# Patient Record
Sex: Female | Born: 1970 | Hispanic: No | Smoking: Never smoker
Health system: Southern US, Community
[De-identification: ages and names within clinical notes are randomized; demographics above are authoritative.]

## PROBLEM LIST (undated history)

## (undated) DIAGNOSIS — Z9889 Other specified postprocedural states: Secondary | ICD-10-CM

## (undated) HISTORY — DX: Other specified postprocedural states: Z98.890

---

## 1995-06-23 HISTORY — PX: AUGMENTATION MAMMAPLASTY: SUR837

## 1999-04-07 ENCOUNTER — Other Ambulatory Visit: Admission: RE | Admit: 1999-04-07 | Discharge: 1999-04-07 | Payer: Self-pay | Admitting: Gynecology

## 2000-05-24 ENCOUNTER — Other Ambulatory Visit: Admission: RE | Admit: 2000-05-24 | Discharge: 2000-05-24 | Payer: Self-pay | Admitting: Gynecology

## 2010-11-11 ENCOUNTER — Other Ambulatory Visit: Payer: Self-pay | Admitting: Internal Medicine

## 2010-11-11 DIAGNOSIS — Z1231 Encounter for screening mammogram for malignant neoplasm of breast: Secondary | ICD-10-CM

## 2010-11-11 DIAGNOSIS — Z0000 Encounter for general adult medical examination without abnormal findings: Secondary | ICD-10-CM

## 2010-11-22 ENCOUNTER — Ambulatory Visit
Admission: RE | Admit: 2010-11-22 | Discharge: 2010-11-22 | Disposition: A | Discharge status: Home / Self Care | Payer: BC Managed Care – PPO | Source: Ambulatory Visit | Attending: Internal Medicine | Admitting: Internal Medicine

## 2010-11-22 DIAGNOSIS — Z0000 Encounter for general adult medical examination without abnormal findings: Secondary | ICD-10-CM

## 2010-11-22 DIAGNOSIS — Z1231 Encounter for screening mammogram for malignant neoplasm of breast: Secondary | ICD-10-CM

## 2012-08-11 ENCOUNTER — Other Ambulatory Visit: Payer: Self-pay | Admitting: Internal Medicine

## 2012-08-11 ENCOUNTER — Other Ambulatory Visit: Payer: Self-pay | Admitting: Family Medicine

## 2012-08-11 DIAGNOSIS — Z1231 Encounter for screening mammogram for malignant neoplasm of breast: Secondary | ICD-10-CM

## 2012-09-15 ENCOUNTER — Ambulatory Visit
Admission: RE | Admit: 2012-09-15 | Discharge: 2012-09-15 | Disposition: A | Discharge status: Home / Self Care | Payer: BC Managed Care – PPO | Source: Ambulatory Visit | Attending: Internal Medicine | Admitting: Internal Medicine

## 2012-09-15 DIAGNOSIS — Z1231 Encounter for screening mammogram for malignant neoplasm of breast: Secondary | ICD-10-CM

## 2013-08-10 ENCOUNTER — Other Ambulatory Visit: Payer: Self-pay

## 2013-08-10 DIAGNOSIS — Z1231 Encounter for screening mammogram for malignant neoplasm of breast: Secondary | ICD-10-CM

## 2013-09-24 ENCOUNTER — Ambulatory Visit
Admission: RE | Admit: 2013-09-24 | Discharge: 2013-09-24 | Disposition: A | Discharge status: Home / Self Care | Payer: BC Managed Care – PPO | Source: Ambulatory Visit

## 2013-09-24 DIAGNOSIS — Z1231 Encounter for screening mammogram for malignant neoplasm of breast: Secondary | ICD-10-CM

## 2014-08-24 ENCOUNTER — Other Ambulatory Visit: Payer: Self-pay

## 2014-08-24 DIAGNOSIS — Z1231 Encounter for screening mammogram for malignant neoplasm of breast: Secondary | ICD-10-CM

## 2014-09-29 ENCOUNTER — Ambulatory Visit
Admission: RE | Admit: 2014-09-29 | Discharge: 2014-09-29 | Disposition: A | Discharge status: Home / Self Care | Payer: BC Managed Care – PPO | Source: Ambulatory Visit

## 2014-09-29 DIAGNOSIS — Z1231 Encounter for screening mammogram for malignant neoplasm of breast: Secondary | ICD-10-CM

## 2015-08-22 ENCOUNTER — Other Ambulatory Visit: Payer: Self-pay

## 2015-08-22 DIAGNOSIS — Z1231 Encounter for screening mammogram for malignant neoplasm of breast: Secondary | ICD-10-CM

## 2015-10-03 ENCOUNTER — Ambulatory Visit
Admission: RE | Admit: 2015-10-03 | Discharge: 2015-10-03 | Disposition: A | Discharge status: Home / Self Care | Payer: BLUE CROSS/BLUE SHIELD | Source: Ambulatory Visit

## 2015-10-03 DIAGNOSIS — Z1231 Encounter for screening mammogram for malignant neoplasm of breast: Secondary | ICD-10-CM

## 2016-08-28 ENCOUNTER — Other Ambulatory Visit: Payer: Self-pay | Admitting: Obstetrics and Gynecology

## 2016-08-28 ENCOUNTER — Other Ambulatory Visit: Payer: Self-pay | Admitting: Family Medicine

## 2016-08-28 DIAGNOSIS — Z1231 Encounter for screening mammogram for malignant neoplasm of breast: Secondary | ICD-10-CM

## 2016-10-03 ENCOUNTER — Ambulatory Visit
Admission: RE | Admit: 2016-10-03 | Discharge: 2016-10-03 | Disposition: A | Discharge status: Home / Self Care | Payer: BLUE CROSS/BLUE SHIELD | Source: Ambulatory Visit | Attending: Obstetrics and Gynecology | Admitting: Obstetrics and Gynecology

## 2016-10-03 DIAGNOSIS — Z1231 Encounter for screening mammogram for malignant neoplasm of breast: Secondary | ICD-10-CM

## 2017-01-29 DIAGNOSIS — N393 Stress incontinence (female) (male): Secondary | ICD-10-CM | POA: Insufficient documentation

## 2017-08-26 ENCOUNTER — Other Ambulatory Visit: Payer: Self-pay | Admitting: Obstetrics and Gynecology

## 2017-08-26 DIAGNOSIS — Z1231 Encounter for screening mammogram for malignant neoplasm of breast: Secondary | ICD-10-CM

## 2017-10-04 ENCOUNTER — Ambulatory Visit
Admission: RE | Admit: 2017-10-04 | Discharge: 2017-10-04 | Disposition: A | Discharge status: Home / Self Care | Payer: BLUE CROSS/BLUE SHIELD | Source: Ambulatory Visit | Attending: Obstetrics and Gynecology | Admitting: Obstetrics and Gynecology

## 2017-10-04 DIAGNOSIS — Z1231 Encounter for screening mammogram for malignant neoplasm of breast: Secondary | ICD-10-CM

## 2019-01-06 ENCOUNTER — Other Ambulatory Visit: Payer: Self-pay | Admitting: Obstetrics and Gynecology

## 2019-01-06 DIAGNOSIS — Z1231 Encounter for screening mammogram for malignant neoplasm of breast: Secondary | ICD-10-CM

## 2019-01-28 ENCOUNTER — Ambulatory Visit: Payer: PRIVATE HEALTH INSURANCE

## 2019-03-19 ENCOUNTER — Ambulatory Visit: Payer: PRIVATE HEALTH INSURANCE

## 2019-05-06 ENCOUNTER — Other Ambulatory Visit: Payer: Self-pay

## 2019-05-06 ENCOUNTER — Ambulatory Visit
Admission: RE | Admit: 2019-05-06 | Discharge: 2019-05-06 | Disposition: A | Discharge status: Home / Self Care | Payer: BC Managed Care – PPO | Source: Ambulatory Visit | Attending: Obstetrics and Gynecology | Admitting: Obstetrics and Gynecology

## 2019-05-06 DIAGNOSIS — Z1231 Encounter for screening mammogram for malignant neoplasm of breast: Secondary | ICD-10-CM

## 2020-04-26 ENCOUNTER — Other Ambulatory Visit: Payer: Self-pay | Admitting: Obstetrics and Gynecology

## 2020-04-26 DIAGNOSIS — Z1231 Encounter for screening mammogram for malignant neoplasm of breast: Secondary | ICD-10-CM

## 2020-05-27 ENCOUNTER — Other Ambulatory Visit: Payer: Self-pay

## 2020-05-27 ENCOUNTER — Encounter: Payer: Self-pay | Admitting: Plastic Surgery

## 2020-05-27 ENCOUNTER — Ambulatory Visit (INDEPENDENT_AMBULATORY_CARE_PROVIDER_SITE_OTHER): Payer: No Typology Code available for payment source | Admitting: Plastic Surgery

## 2020-05-27 DIAGNOSIS — D229 Melanocytic nevi, unspecified: Secondary | ICD-10-CM | POA: Insufficient documentation

## 2020-05-27 NOTE — Progress Notes (Signed)
     Patient ID: Jodi Kennedy, female    DOB: [birth year], 49 y.o.   MRN: [id number]   Chief Complaint  Patient presents with  . Consult  . Skin Problem    The patient is a 49 year old female here for evaluation of several skin lesions.  She has seen the dermatologist.  She is interested in having them removed.  The dermatologist is not concerned with skin cancer at this time.  The main concern is that they are getting larger.  They are flesh-colored.  There is one on the right lower cheek, right nasal ala and left lobule.  They are 3 to 4 mm in size.  There are no other concerning lesions.  She does not have a family history of skin cancer she is otherwise in good health.  Nothing seems to make these areas better.  They do seem to be getting larger with time.  Her main concern is what the scar will look like.  Several years ago we excised a nevus on her daughter's face.  She says that you cannot even see where it was.  She is very pleased with the results.   Review of Systems  Constitutional: Negative.   HENT: Negative.   Eyes: Negative.   Respiratory: Negative.   Cardiovascular: Negative.   Endocrine: Negative.   Genitourinary: Negative.   Musculoskeletal: Negative.   Neurological: Negative.   Hematological: Negative.   Psychiatric/Behavioral: Negative.     History reviewed. No pertinent past medical history.  Past Surgical History:  Procedure Laterality Date  . AUGMENTATION MAMMAPLASTY Bilateral 06/1995      Current Outpatient Medications:  .  ibuprofen (ADVIL) 800 MG tablet, 1 tab p.o. every 6 hours as needed pain, Disp: , Rfl:  .  Multiple Vitamin (MULTIVITAMIN) capsule, Take 1 capsule by mouth daily., Disp: , Rfl:    Objective:   Vitals:   05/27/20 0840  BP: 138/79  Pulse: 71  Temp: 98.3 F (36.8 C)  SpO2: 97%    Physical Exam Vitals and nursing note reviewed.  Constitutional:      Appearance: Normal appearance.  HENT:     Head: Normocephalic and atraumatic.    Cardiovascular:     Rate and Rhythm: Normal rate.     Pulses: Normal pulses.  Pulmonary:     Effort: Pulmonary effort is normal.  Skin:    General: Skin is warm.  Neurological:     General: No focal deficit present.     Mental Status: She is alert and oriented to person, place, and time.  Psychiatric:        Mood and Affect: Mood normal.        Behavior: Behavior normal.     Assessment & Plan:  Nevus  We discussed options for treatment.  She can have it shaved off but there is a likelihood it would return.  She can also have each of them excised.  She would have a small scar.  It would probably fade in time.  Should be a good candidate for using the skin after the excision. She would like to move ahead with a quote for excision of the 3 areas.  This includes the right lower face, right ala and left lobule.  Pictures were obtained of the patient and placed in the chart with the patient's or guardian's permission.   [place], DO

## 2020-06-15 ENCOUNTER — Ambulatory Visit
Admission: RE | Admit: 2020-06-15 | Discharge: 2020-06-15 | Disposition: A | Discharge status: Home / Self Care | Payer: No Typology Code available for payment source | Source: Ambulatory Visit | Attending: Obstetrics and Gynecology | Admitting: Obstetrics and Gynecology

## 2020-06-15 ENCOUNTER — Other Ambulatory Visit: Payer: Self-pay

## 2020-06-15 DIAGNOSIS — Z1231 Encounter for screening mammogram for malignant neoplasm of breast: Secondary | ICD-10-CM

## 2020-07-26 ENCOUNTER — Other Ambulatory Visit: Payer: Self-pay

## 2020-07-26 ENCOUNTER — Encounter: Payer: Self-pay | Admitting: Plastic Surgery

## 2020-07-26 ENCOUNTER — Ambulatory Visit (INDEPENDENT_AMBULATORY_CARE_PROVIDER_SITE_OTHER): Payer: No Typology Code available for payment source | Admitting: Plastic Surgery

## 2020-07-26 VITALS — BP 126/84 | HR 86 | Temp 98.2°F

## 2020-07-26 DIAGNOSIS — D229 Melanocytic nevi, unspecified: Secondary | ICD-10-CM | POA: Diagnosis not present

## 2020-07-26 NOTE — Progress Notes (Addendum)
Procedure Note  Preoperative Dx: Mole on nose and chin  Postoperative Dx: Same  Procedure: Excision of mole of nose 4 mm and chin 5 mm  Anesthesia: Lidocaine 1% with 1:100,000 epinepherine   Description of Procedure: Risks and complications were explained to the patient.  Consent was confirmed and the patient understands the risks and benefits.  The potential complications and alternatives were explained and the patient consents.  The patient expressed understanding the option of not having the procedure and the risks of a scar.  Time out was called and all information was confirmed to be correct.  The area was prepped and drapped.  Lidocaine 1% with epinepherine was injected in the subcutaneous area.   Chin: After waiting several minutes for the local to take affect a #15 blade was used to excise the area in an eliptical pattern.  A 5-0 Monocryl was used to close the skin edges.    Nose:  After waiting several minutes for the local to take affect a #15 blade was used to excise the area in an eliptical pattern.  A 5-0 Monocryl was used to close the skin edges.    A dressing was applied.  The patient was given instructions on how to care for the area and a follow up appointment.  Jeneva tolerated the procedure well and there were no complications. The patient decided to forego pathology and I think this is reasonable.

## 2020-08-05 ENCOUNTER — Other Ambulatory Visit: Payer: Self-pay

## 2020-08-05 ENCOUNTER — Encounter: Payer: Self-pay | Admitting: Surgical

## 2020-08-05 ENCOUNTER — Ambulatory Visit (INDEPENDENT_AMBULATORY_CARE_PROVIDER_SITE_OTHER): Payer: No Typology Code available for payment source | Admitting: Surgical

## 2020-08-05 VITALS — BP 125/81 | HR 77 | Temp 97.8°F

## 2020-08-05 DIAGNOSIS — Z719 Counseling, unspecified: Secondary | ICD-10-CM

## 2020-08-05 DIAGNOSIS — D229 Melanocytic nevi, unspecified: Secondary | ICD-10-CM

## 2020-08-05 NOTE — Progress Notes (Signed)
Patient is a 49 year old female here for follow-up after excision of a chin nevus and nose nevus on 07/26/2020 with Dr. Marla Roe.  Patient is doing well, no complaints.  No infectious symptoms.  On exam incisions are healed, some crusting noted.  Monocryl sutures in place.  No erythema.  No cellulitic changes.  No drainage noted.  Recommend sunscreen when exposed to the sun. Can begin using scar cream in the next 3 days or so. Follow-up as needed.  Call with any questions or concerns.

## 2020-10-01 DIAGNOSIS — E559 Vitamin D deficiency, unspecified: Secondary | ICD-10-CM | POA: Insufficient documentation

## 2020-10-01 DIAGNOSIS — E7800 Pure hypercholesterolemia, unspecified: Secondary | ICD-10-CM | POA: Insufficient documentation

## 2021-05-30 ENCOUNTER — Other Ambulatory Visit: Payer: Self-pay | Admitting: Obstetrics and Gynecology

## 2021-05-30 DIAGNOSIS — Z1231 Encounter for screening mammogram for malignant neoplasm of breast: Secondary | ICD-10-CM

## 2021-07-19 ENCOUNTER — Ambulatory Visit
Admission: RE | Admit: 2021-07-19 | Discharge: 2021-07-19 | Disposition: A | Discharge status: Home / Self Care | Payer: No Typology Code available for payment source | Source: Ambulatory Visit | Attending: Obstetrics and Gynecology | Admitting: Obstetrics and Gynecology

## 2021-07-19 ENCOUNTER — Other Ambulatory Visit: Payer: Self-pay

## 2021-07-19 DIAGNOSIS — Z1231 Encounter for screening mammogram for malignant neoplasm of breast: Secondary | ICD-10-CM

## 2022-02-15 DIAGNOSIS — R030 Elevated blood-pressure reading, without diagnosis of hypertension: Secondary | ICD-10-CM | POA: Insufficient documentation

## 2022-03-03 IMAGING — MG DIGITAL SCREENING BREAST BILAT IMPLANT W/ TOMO W/ CAD
9 of 12 series · 9 of 28 positions shown · non-contrast
Comparison: Previous exam(s).

CLINICAL DATA: Screening.

EXAM:
DIGITAL SCREENING BILATERAL MAMMOGRAM WITH IMPLANTS, CAD AND
TOMOSYNTHESIS
TECHNIQUE: Bilateral screening digital craniocaudal and mediolateral oblique
mammograms were obtained. Bilateral screening digital breast
tomosynthesis was performed. The images were evaluated with
computer-aided detection. Standard and/or implant displaced views
were performed.

[R CC]
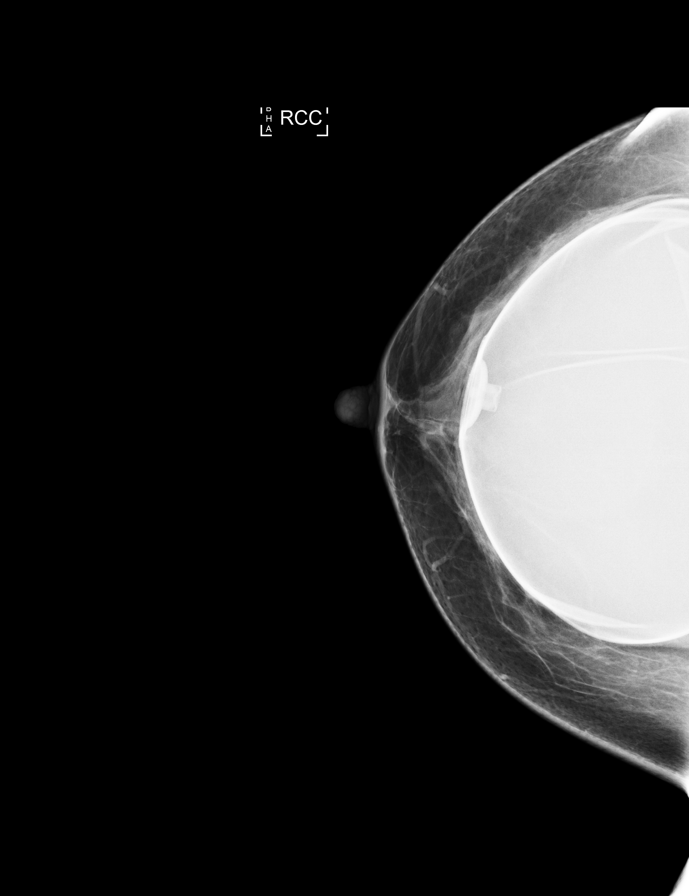

[R MLO]
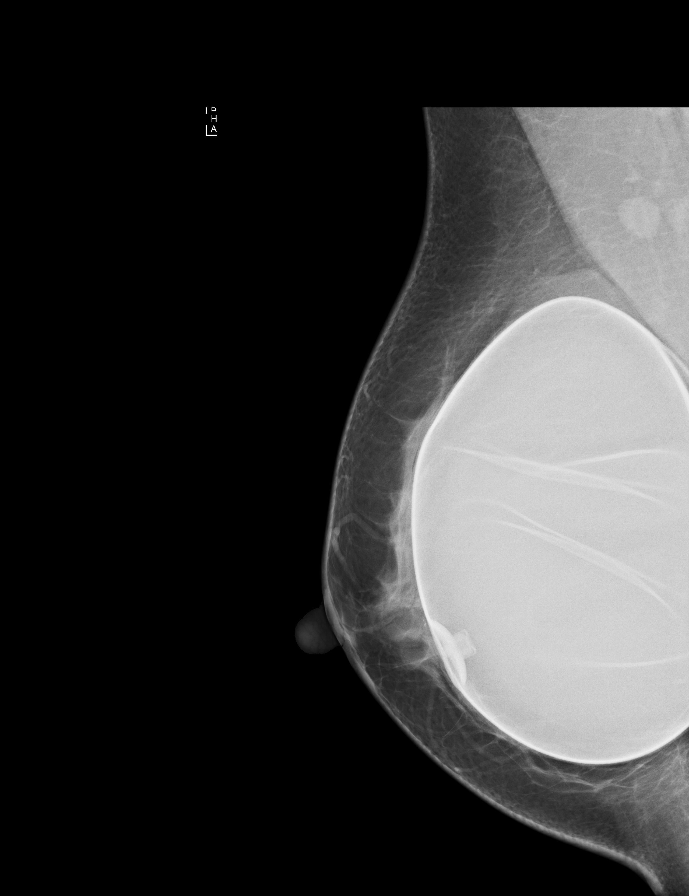

[L MLO]
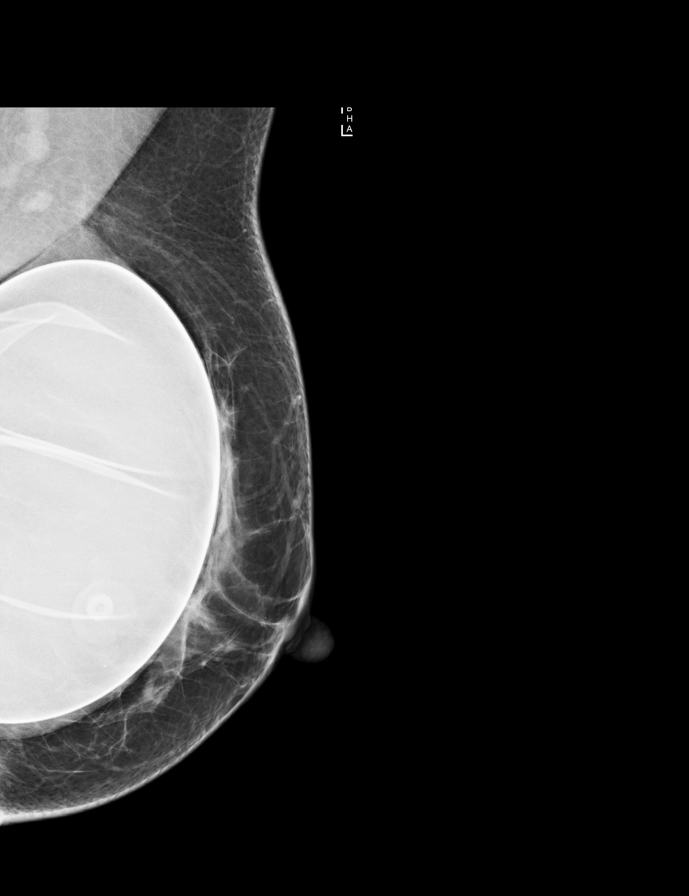

[L CC]
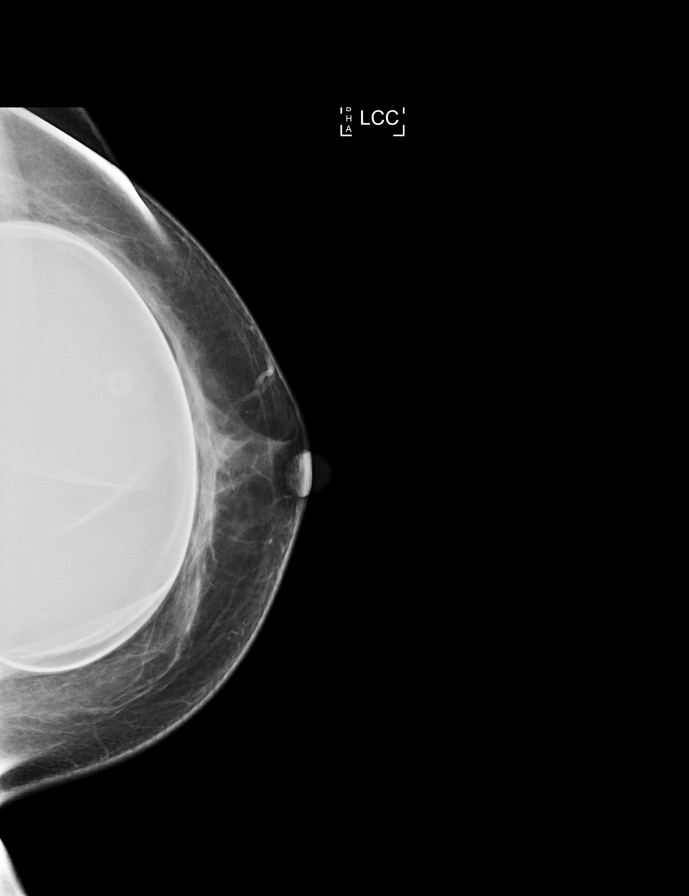

[R MLO synth-2D]
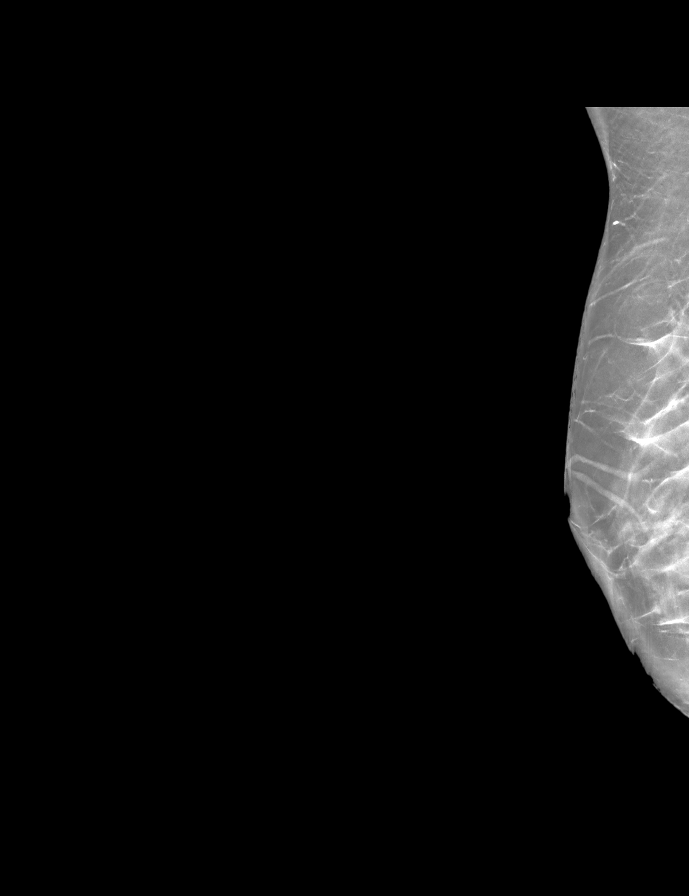

[R CC synth-2D]
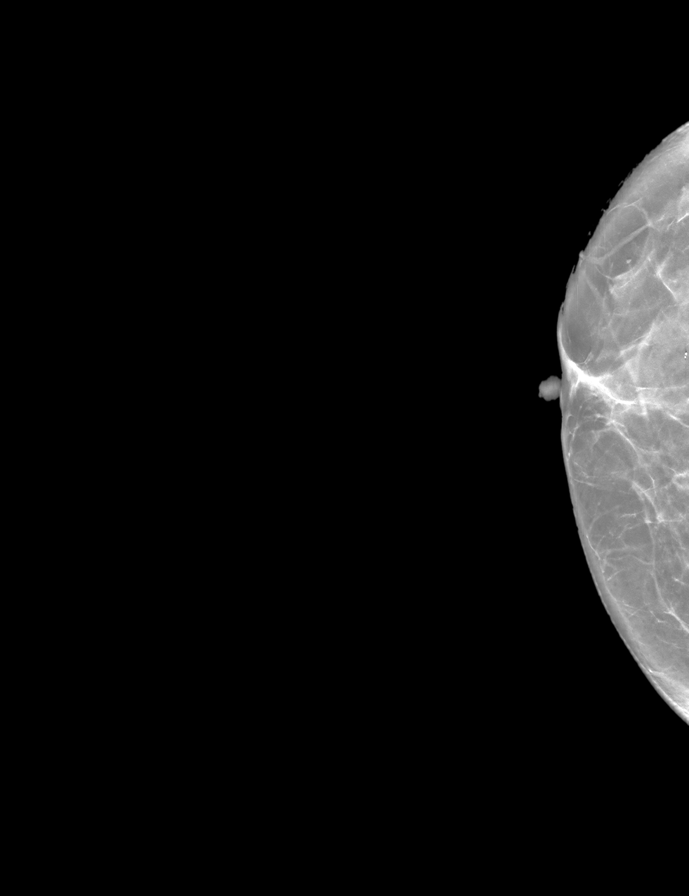

[L CC synth-2D]
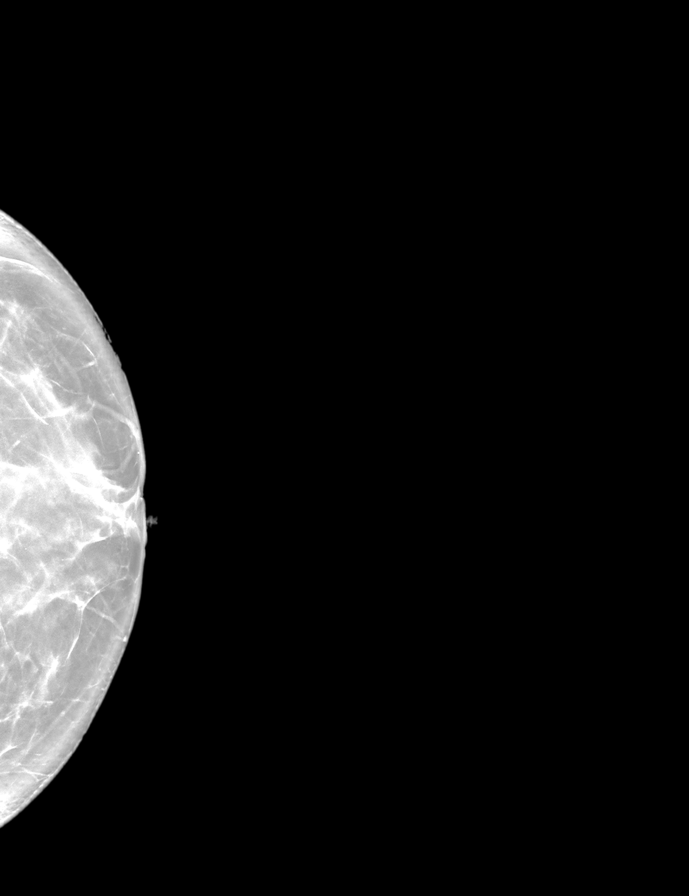

[L MLO synth-2D]
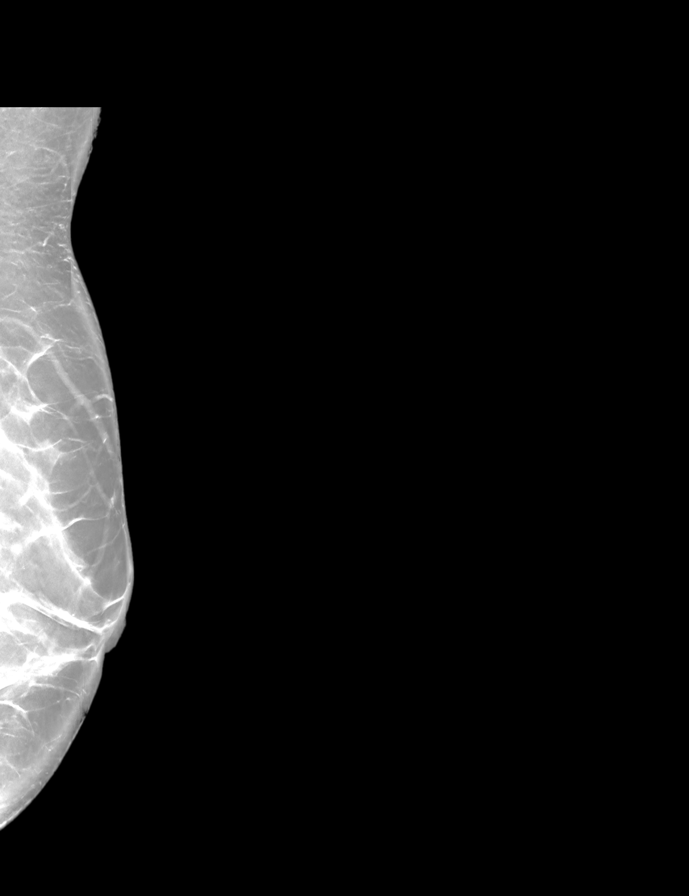

[R CCID BREAST TOMOSYNTHESIS IMAGE tomo · tomo slice 29/58.0]
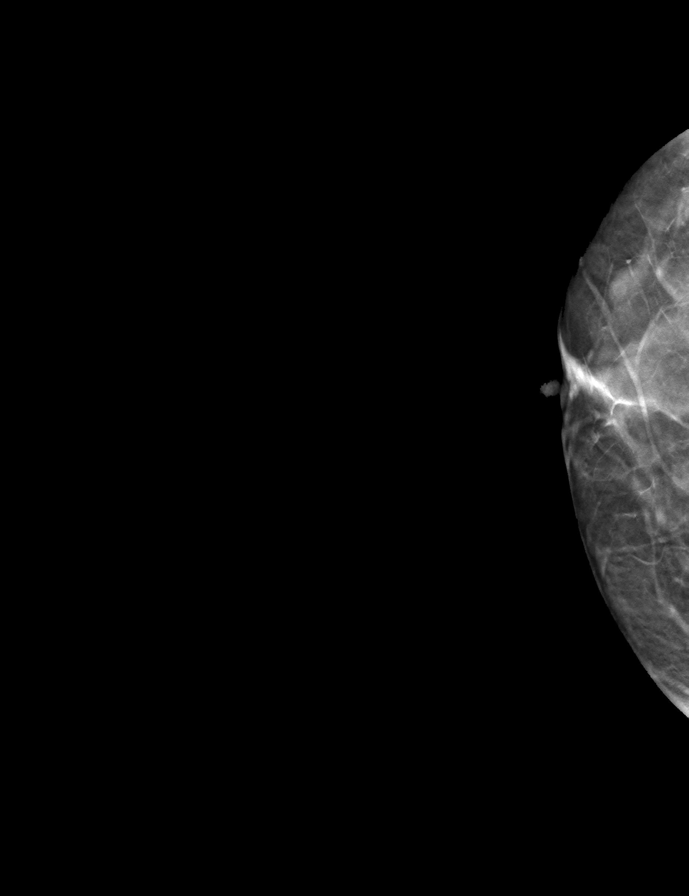

[9 of 28 positions shown; findings below may reference images not displayed]

ACR Breast Density Category c: The breast tissue is heterogeneously
dense, which may obscure small masses.
FINDINGS: The patient has retroglandular implants. There are no findings
suspicious for malignancy.
IMPRESSION: No mammographic evidence of malignancy. A result letter of this
screening mammogram will be mailed directly to the patient.

RECOMMENDATION:
Screening mammogram in one year. (Code:JF-9-JC8)

BI-RADS CATEGORY  1:  Negative.

## 2022-05-03 DIAGNOSIS — D219 Benign neoplasm of connective and other soft tissue, unspecified: Secondary | ICD-10-CM | POA: Insufficient documentation

## 2022-05-03 DIAGNOSIS — R9389 Abnormal findings on diagnostic imaging of other specified body structures: Secondary | ICD-10-CM | POA: Insufficient documentation

## 2022-05-22 DIAGNOSIS — R87610 Atypical squamous cells of undetermined significance on cytologic smear of cervix (ASC-US): Secondary | ICD-10-CM | POA: Insufficient documentation

## 2022-06-12 ENCOUNTER — Other Ambulatory Visit: Payer: Self-pay | Admitting: Obstetrics and Gynecology

## 2022-06-12 DIAGNOSIS — Z1231 Encounter for screening mammogram for malignant neoplasm of breast: Secondary | ICD-10-CM

## 2022-06-22 HISTORY — PX: ROBOTIC ASSISTED TOTAL HYSTERECTOMY: SHX6085

## 2022-08-15 ENCOUNTER — Ambulatory Visit
Admission: RE | Admit: 2022-08-15 | Discharge: 2022-08-15 | Disposition: A | Discharge status: Home / Self Care | Payer: No Typology Code available for payment source | Source: Ambulatory Visit | Attending: Obstetrics and Gynecology | Admitting: Obstetrics and Gynecology

## 2022-08-15 DIAGNOSIS — Z1231 Encounter for screening mammogram for malignant neoplasm of breast: Secondary | ICD-10-CM

## 2022-08-21 ENCOUNTER — Encounter: Payer: Self-pay | Admitting: Plastic Surgery

## 2022-08-21 ENCOUNTER — Ambulatory Visit (INDEPENDENT_AMBULATORY_CARE_PROVIDER_SITE_OTHER): Payer: No Typology Code available for payment source | Admitting: Plastic Surgery

## 2022-08-21 VITALS — BP 136/82 | HR 78 | Ht 67.0 in | Wt 161.4 lb

## 2022-08-21 DIAGNOSIS — D229 Melanocytic nevi, unspecified: Secondary | ICD-10-CM

## 2022-08-21 NOTE — Progress Notes (Signed)
     Patient ID: Jodi Kennedy, female    DOB: 1971-02-27, 51 y.o.   MRN: [id number]   Chief Complaint  Patient presents with   Consult   Skin Problem    The patient is a 51 year old female here for evaluation of her face.  She has had some skin lesions in the past that were removed.  She had a precancerous lesion removed from her back in the past.  Today she is concerned about a changing skin lesion on her left cheek.  It has gotten a little smaller but it is firm and raised.  It is flesh-colored.  Nothing seems to make it better.  She is concerned about the change and would like to have it removed.    Review of Systems  Constitutional: Negative.   Eyes: Negative.   Respiratory: Negative.    Cardiovascular: Negative.   Gastrointestinal: Negative.   Endocrine: Negative.   Genitourinary: Negative.   Musculoskeletal: Negative.   Psychiatric/Behavioral: Negative.      History reviewed. No pertinent past medical history.  Past Surgical History:  Procedure Laterality Date   AUGMENTATION MAMMAPLASTY Bilateral 06/1995      Current Outpatient Medications:    ibuprofen (ADVIL) 800 MG tablet, 1 tab p.o. every 6 hours as needed pain, Disp: , Rfl:    Multiple Vitamin (MULTIVITAMIN) capsule, Take 1 capsule by mouth daily., Disp: , Rfl:    Vitamin D, Ergocalciferol, (DRISDOL) 1.25 MG (50000 UNIT) CAPS capsule, Take 50,000 Units by mouth once a week., Disp: , Rfl:    Objective:   Vitals:   08/21/22 1256  BP: 136/82  Pulse: 78  SpO2: 96%    Physical Exam Constitutional:      Appearance: Normal appearance.  Cardiovascular:     Rate and Rhythm: Normal rate and regular rhythm.     Pulses: Normal pulses.  Pulmonary:     Effort: Pulmonary effort is normal.  Skin:    General: Skin is warm.     Capillary Refill: Capillary refill takes less than 2 seconds.  Neurological:     Mental Status: She is alert and oriented to person, place, and time.  Psychiatric:        Mood and  Affect: Mood normal.        Behavior: Behavior normal.        Thought Content: Thought content normal.        Judgment: Judgment normal.     Assessment & Plan:  Nevus  Plan for excision of changing skin lesion of left cheek in the office.  The scar was discussed and follow-up plan.  Pictures were obtained of the patient and placed in the chart with the patient's or guardian's permission.  Loel Lofty Olanda Boughner, DO

## 2022-08-24 ENCOUNTER — Institutional Professional Consult (permissible substitution): Payer: No Typology Code available for payment source | Admitting: Plastic Surgery

## 2022-09-03 DIAGNOSIS — Z90710 Acquired absence of both cervix and uterus: Secondary | ICD-10-CM | POA: Insufficient documentation

## 2022-11-16 ENCOUNTER — Ambulatory Visit: Payer: No Typology Code available for payment source | Admitting: Plastic Surgery

## 2023-01-11 DIAGNOSIS — E611 Iron deficiency: Secondary | ICD-10-CM | POA: Insufficient documentation

## 2023-01-14 ENCOUNTER — Ambulatory Visit (AMBULATORY_SURGERY_CENTER): Payer: 59

## 2023-01-14 ENCOUNTER — Encounter: Payer: Self-pay | Admitting: Gastroenterology

## 2023-01-14 VITALS — Ht 67.0 in | Wt 164.0 lb

## 2023-01-14 DIAGNOSIS — Z872 Personal history of diseases of the skin and subcutaneous tissue: Secondary | ICD-10-CM | POA: Insufficient documentation

## 2023-01-14 DIAGNOSIS — D485 Neoplasm of uncertain behavior of skin: Secondary | ICD-10-CM | POA: Insufficient documentation

## 2023-01-14 DIAGNOSIS — L578 Other skin changes due to chronic exposure to nonionizing radiation: Secondary | ICD-10-CM | POA: Insufficient documentation

## 2023-01-14 DIAGNOSIS — L820 Inflamed seborrheic keratosis: Secondary | ICD-10-CM | POA: Insufficient documentation

## 2023-01-14 DIAGNOSIS — D2230 Melanocytic nevi of unspecified part of face: Secondary | ICD-10-CM | POA: Insufficient documentation

## 2023-01-14 DIAGNOSIS — D225 Melanocytic nevi of trunk: Secondary | ICD-10-CM | POA: Insufficient documentation

## 2023-01-14 DIAGNOSIS — D220 Melanocytic nevi of lip: Secondary | ICD-10-CM | POA: Insufficient documentation

## 2023-01-14 DIAGNOSIS — L570 Actinic keratosis: Secondary | ICD-10-CM | POA: Insufficient documentation

## 2023-01-14 DIAGNOSIS — D2261 Melanocytic nevi of right upper limb, including shoulder: Secondary | ICD-10-CM | POA: Insufficient documentation

## 2023-01-14 DIAGNOSIS — D2272 Melanocytic nevi of left lower limb, including hip: Secondary | ICD-10-CM | POA: Insufficient documentation

## 2023-01-14 DIAGNOSIS — Z85828 Personal history of other malignant neoplasm of skin: Secondary | ICD-10-CM | POA: Insufficient documentation

## 2023-01-14 DIAGNOSIS — Z808 Family history of malignant neoplasm of other organs or systems: Secondary | ICD-10-CM | POA: Insufficient documentation

## 2023-01-14 DIAGNOSIS — Z86018 Personal history of other benign neoplasm: Secondary | ICD-10-CM | POA: Insufficient documentation

## 2023-01-14 DIAGNOSIS — Z1211 Encounter for screening for malignant neoplasm of colon: Secondary | ICD-10-CM

## 2023-01-14 DIAGNOSIS — B078 Other viral warts: Secondary | ICD-10-CM | POA: Insufficient documentation

## 2023-01-14 MED ORDER — NA SULFATE-K SULFATE-MG SULF 17.5-3.13-1.6 GM/177ML PO SOLN: 1.0000 | Freq: Once | ORAL | 0 refills | Status: AC

## 2023-01-14 NOTE — Progress Notes (Signed)
No egg or soy allergy known to patient  No issues known to pt with past sedation with any surgeries or procedures - severe PONV  Patient denies ever being told they had issues or difficulty with intubation  No FH of Malignant Hyperthermia Pt is not on diet pills Pt is not on  home 02  Pt is not on blood thinners  Pt with chronic constipation has a BM about every other day. Pt does have to strain typically nor are stools hard or rock like. Pt does take metamucil when needed.  No A fib or A flutter Have any cardiac testing pending--no  Pt instructed to use Single[employer] or GoodRx for a price reduction on prep   Patient's chart reviewed by Osvaldo Angst CNRA prior to previsit and patient appropriate for the [facility].  Previsit completed and red dot placed by patient's name on their procedure day (on provider's schedule).

## 2023-01-17 ENCOUNTER — Telehealth: Payer: Self-pay | Admitting: Gastroenterology

## 2023-01-17 NOTE — Telephone Encounter (Signed)
Inbound call from patient requesting to resent the kit-prep to a new pharmacy at [facility] ,[address], [place] [address].Please advise

## 2023-01-21 ENCOUNTER — Other Ambulatory Visit: Payer: Self-pay

## 2023-01-21 DIAGNOSIS — Z1211 Encounter for screening for malignant neoplasm of colon: Secondary | ICD-10-CM

## 2023-01-21 MED ORDER — NA SULFATE-K SULFATE-MG SULF 17.5-3.13-1.6 GM/177ML PO SOLN: 1.0000 | Freq: Once | ORAL | 0 refills | Status: AC

## 2023-01-21 NOTE — Telephone Encounter (Signed)
New rx snet left VM making pt aware

## 2023-01-30 ENCOUNTER — Encounter: Payer: Self-pay | Admitting: Gastroenterology

## 2023-01-30 ENCOUNTER — Ambulatory Visit (AMBULATORY_SURGERY_CENTER): Payer: 59 | Admitting: Gastroenterology

## 2023-01-30 VITALS — BP 125/77 | HR 67 | Temp 98.4°F | Resp 18 | Ht 67.0 in | Wt 164.0 lb

## 2023-01-30 DIAGNOSIS — K6389 Other specified diseases of intestine: Secondary | ICD-10-CM | POA: Diagnosis not present

## 2023-01-30 DIAGNOSIS — D120 Benign neoplasm of cecum: Secondary | ICD-10-CM

## 2023-01-30 DIAGNOSIS — Z1211 Encounter for screening for malignant neoplasm of colon: Secondary | ICD-10-CM | POA: Diagnosis present

## 2023-01-30 MED ORDER — SODIUM CHLORIDE 0.9 % IV SOLN: 500.0000 mL | Freq: Once | INTRAVENOUS | Status: DC

## 2023-01-30 NOTE — Op Note (Signed)
[facility] Patient Name: Jodi Kennedy Procedure Date: 01/30/2023 10:23 AM MRN: [id number] Endoscopist: Lynann Bologna , MD, [id number] Age: 52 Referring MD:  Date of Birth: May 27, 1971 Gender: Female Account #: [id number] Procedure:                Colonoscopy Indications:              Screening for colorectal malignant neoplasm Medicines:                Monitored Anesthesia Care Procedure:                Pre-Anesthesia Assessment:                           - Prior to the procedure, a History and Physical                            was performed, and patient medications and                            allergies were reviewed. The patient's tolerance of                            previous anesthesia was also reviewed. The risks                            and benefits of the procedure and the sedation                            options and risks were discussed with the patient.                            All questions were answered, and informed consent                            was obtained. Prior Anticoagulants: The patient has                            taken no anticoagulant or antiplatelet agents. ASA                            Grade Assessment: II - A patient with mild systemic                            disease. After reviewing the risks and benefits,                            the patient was deemed in satisfactory condition to                            undergo the procedure.                           After obtaining informed consent, the colonoscope  was passed under direct vision. Throughout the                            procedure, the patient's blood pressure, pulse, and                            oxygen saturations were monitored continuously. The                            [id number]-HQ190L Colonoscope [id number] was introduced                            through the anus and advanced to the 2 cm into the                            ileum. The  colonoscopy was performed without                            difficulty. The colon was tortuous and somewhat                            difficult to examine. The patient tolerated the                            procedure well. The quality of the bowel                            preparation was good. The terminal ileum, ileocecal                            valve, appendiceal orifice, and rectum were                            photographed. Scope In: 10:26:05 AM Scope Out: 10:40:03 AM Scope Withdrawal Time: 0 hours 7 minutes 57 seconds  Total Procedure Duration: 0 hours 13 minutes 58 seconds  Findings:                 A 2 mm polyp was found in the cecum. The polyp was                            sessile. The polyp was removed with a cold biopsy                            forceps. Resection and retrieval were complete.                           A few medium-mouthed diverticula were found in the                            sigmoid colon and transverse colon.                           Non-bleeding internal hemorrhoids were found during  retroflexion. The hemorrhoids were small and Grade                            I (internal hemorrhoids that do not prolapse).                           The terminal ileum appeared normal.                           The exam was otherwise without abnormality on                            direct and retroflexion views. The colon was                            redundant and tortuous. Complications:            No immediate complications. Estimated Blood Loss:     Estimated blood loss: none. Impression:               - One 2 mm polyp in the cecum, removed with a cold                            biopsy forceps. Resected and retrieved.                           - Mild colonic diverticulosis.                           - Non-bleeding internal hemorrhoids.                           - The examined portion of the ileum was normal.                            - The examination was otherwise normal on direct                            and retroflexion views. Recommendation:           - Patient has a contact number available for                            emergencies. The signs and symptoms of potential                            delayed complications were discussed with the                            patient. Return to normal activities tomorrow.                            Written discharge instructions were provided to the                            patient.                           -  High fiber diet.                           - Continue present medications.                           - MiraLAX 17 g p.o. daily                           - Await pathology results.                           - Repeat colonoscopy for surveillance based on                            pathology results.                           - The findings and recommendations were discussed                            with the patient's family. Lynann Bolognaajesh Jafari Mckillop, MD 01/30/2023 10:44:32 AM This report has been signed electronically.

## 2023-01-30 NOTE — Progress Notes (Signed)
Vss nad trans to pacu 

## 2023-01-30 NOTE — Progress Notes (Signed)
Called to room to assist during endoscopic procedure.  Patient ID and intended procedure confirmed with present staff. Received instructions for my participation in the procedure from the performing physician.  

## 2023-01-30 NOTE — Patient Instructions (Signed)
Please read handouts provided. Continue present medications. Await pathology results. High Fiber Diet. Miralax 17 grams daily.   YOU HAD AN ENDOSCOPIC PROCEDURE TODAY AT THE [facility]:   Refer to the procedure report that was given to you for any specific questions about what was found during the examination.  If the procedure report does not answer your questions, please call your gastroenterologist to clarify.  If you requested that your care partner not be given the details of your procedure findings, then the procedure report has been included in a sealed envelope for you to review at your convenience later.  YOU SHOULD EXPECT: Some feelings of bloating in the abdomen. Passage of more gas than usual.  Walking can help get rid of the air that was put into your GI tract during the procedure and reduce the bloating. If you had a lower endoscopy (such as a colonoscopy or flexible sigmoidoscopy) you may notice spotting of blood in your stool or on the toilet paper. If you underwent a bowel prep for your procedure, you may not have a normal bowel movement for a few days.  Please Note:  You might notice some irritation and congestion in your nose or some drainage.  This is from the oxygen used during your procedure.  There is no need for concern and it should clear up in a day or so.  SYMPTOMS TO REPORT IMMEDIATELY:  Following lower endoscopy (colonoscopy or flexible sigmoidoscopy):  Excessive amounts of blood in the stool  Significant tenderness or worsening of abdominal pains  Swelling of the abdomen that is new, acute  Fever of 100F or higher  For urgent or emergent issues, a gastroenterologist can be reached at any hour by calling [phone]. Do not use MyChart messaging for urgent concerns.    DIET:  We do recommend a small meal at first, but then you may proceed to your regular diet.  Drink plenty of fluids but you should avoid alcoholic beverages for 24  hours.  ACTIVITY:  You should plan to take it easy for the rest of today and you should NOT DRIVE or use heavy machinery until tomorrow (because of the sedation medicines used during the test).    FOLLOW UP: Our staff will call the number listed on your records the next business day following your procedure.  We will call around 7:15- 8:00 am to check on you and address any questions or concerns that you may have regarding the information given to you following your procedure. If we do not reach you, we will leave a message.     If any biopsies were taken you will be contacted by phone or by letter within the next 1-3 weeks.  Please call us at [phone] if you have not heard about the biopsies in 3 weeks.    SIGNATURES/CONFIDENTIALITY: You and/or your care partner have signed paperwork which will be entered into your electronic medical record.  These signatures attest to the fact that that the information above on your After Visit Summary has been reviewed and is understood.  Full responsibility of the confidentiality of this discharge information lies with you and/or your care-partner.

## 2023-01-30 NOTE — Progress Notes (Signed)
[facility] History and Physical   Primary Care Physician:  Krystal Clark, NP   Reason for Procedure:   CRC screening  Plan:    colol     HPI: Jodi Kennedy is a 52 y.o. female    Past Medical History:  Diagnosis Date   PONV (postoperative nausea and vomiting)     Past Surgical History:  Procedure Laterality Date   AUGMENTATION MAMMAPLASTY Bilateral 06/1995   ROBOTIC ASSISTED TOTAL HYSTERECTOMY  06/2022    Prior to Admission medications   Medication Sig Start Date End Date Taking? Authorizing Provider  acetaminophen (TYLENOL) 500 MG tablet Take by mouth. 07/17/22  Yes [provider]  Multiple Vitamin (MULTIVITAMIN) capsule Take 1 capsule by mouth daily.   Yes [provider]  Vitamin D, Ergocalciferol, (DRISDOL) 1.25 MG (50000 UNIT) CAPS capsule Take 50,000 Units by mouth once a week. 06/15/22  Yes [provider]  ferrous sulfate 325 (65 FE) MG EC tablet Take by mouth. Patient not taking: Reported on 01/14/2023 06/08/22   [provider]  ibuprofen (ADVIL) 800 MG tablet 1 tab p.o. every 6 hours as needed pain Patient not taking: Reported on 01/14/2023 03/30/19   [provider]  traMADol (ULTRAM) 50 MG tablet Take 50 mg by mouth every 6 (six) hours as needed. Patient not taking: Reported on 01/14/2023 07/17/22   [provider]  tretinoin (RETIN-A) 0.025 % cream 1 application to affected area in the evening to face Externally Once a day Patient not taking: Reported on 01/14/2023 07/18/17   [provider]    Current Outpatient Medications  Medication Sig Dispense Refill   acetaminophen (TYLENOL) 500 MG tablet Take by mouth.     Multiple Vitamin (MULTIVITAMIN) capsule Take 1 capsule by mouth daily.     Vitamin D, Ergocalciferol, (DRISDOL) 1.25 MG (50000 UNIT) CAPS capsule Take 50,000 Units by mouth once a week.     ferrous sulfate 325 (65 FE) MG EC tablet Take by mouth. (Patient not  taking: Reported on 01/14/2023)     ibuprofen (ADVIL) 800 MG tablet 1 tab p.o. every 6 hours as needed pain (Patient not taking: Reported on 01/14/2023)     traMADol (ULTRAM) 50 MG tablet Take 50 mg by mouth every 6 (six) hours as needed. (Patient not taking: Reported on 01/14/2023)     tretinoin (RETIN-A) 0.025 % cream 1 application to affected area in the evening to face Externally Once a day (Patient not taking: Reported on 01/14/2023)     Current Facility-Administered Medications  Medication Dose Route Frequency Provider Last Rate Last Admin   0.9 %  sodium chloride infusion  500 mL Intravenous Once Lynann Bologna, MD        Allergies as of 01/30/2023   (No Known Allergies)    Family History  Problem Relation Age of Onset   Breast cancer Paternal Aunt        in 86's   Breast cancer Paternal Aunt        unsure of age   Breast cancer Paternal Aunt        unsure of age   Colon cancer Neg Hx    Colon polyps Neg Hx    Esophageal cancer Neg Hx    Rectal cancer Neg Hx    Stomach cancer Neg Hx     Social History   Socioeconomic History   Marital status: Unknown    Spouse name: Not on file   Number of children: Not on  file   Years of education: Not on file   Highest education level: Not on file  Occupational History   Not on file  Tobacco Use   Smoking status: Never   Smokeless tobacco: Never  Vaping Use   Vaping Use: Never used  Substance and Sexual Activity   Alcohol use: Yes    Comment: occ wine with dinner   Drug use: Not Currently   Sexual activity: Not on file  Other Topics Concern   Not on file  Social History Narrative   Not on file   Social Determinants of Health   Financial Resource Strain: Not on file  Food Insecurity: Not on file  Transportation Needs: Not on file  Physical Activity: Not on file  Stress: Not on file  Social Connections: Not on file  Intimate Partner Violence: Not on file    Review of Systems: Positive for none All other review of  systems negative except as mentioned in the HPI.  Physical Exam: Vital signs in last 24 hours: @VSRANGES @   General:   Alert,  Well-developed, well-nourished, pleasant and cooperative in NAD Lungs:  Clear throughout to auscultation.   Heart:  Regular rate and rhythm; no murmurs, clicks, rubs,  or gallops. Abdomen:  Soft, nontender and nondistended. Normal bowel sounds.   Neuro/Psych:  Alert and cooperative. Normal mood and affect. A and O x 3    No significant changes were identified.  The patient continues to be an appropriate candidate for the planned procedure and anesthesia.   Edman Circle, MD. [facility] 01/30/2023 10:17 AM@

## 2023-01-30 NOTE — Progress Notes (Signed)
Pt's states no medical or surgical changes since previsit or office visit. 

## 2023-01-31 ENCOUNTER — Telehealth: Payer: Self-pay

## 2023-01-31 NOTE — Telephone Encounter (Signed)
  Follow up Call-     01/30/2023    9:22 AM  Call back number  Post procedure Call Back phone  # [phone]  Permission to leave phone message Yes     Patient questions:  Do you have a fever, pain , or abdominal swelling? No. Pain Score  0 *  Have you tolerated food without any problems? Yes.    Have you been able to return to your normal activities? Yes.    Do you have any questions about your discharge instructions: Diet   No. Medications  No. Follow up visit  No.  Do you have questions or concerns about your Care? No.  Actions: * If pain score is 4 or above: No action needed, pain <4.

## 2023-02-07 ENCOUNTER — Encounter: Payer: Self-pay | Admitting: Gastroenterology

## 2023-02-15 ENCOUNTER — Ambulatory Visit: Payer: No Typology Code available for payment source | Admitting: Plastic Surgery

## 2023-07-02 ENCOUNTER — Ambulatory Visit: Payer: No Typology Code available for payment source | Admitting: Plastic Surgery
